# Patient Record
Sex: Male | Born: 1955 | Race: Black or African American | Hispanic: No | Marital: Married | Smoking: Never smoker
Health system: Southern US, Community
[De-identification: ages and names within clinical notes are randomized; demographics above are authoritative.]

## PROBLEM LIST (undated history)

## (undated) DIAGNOSIS — I2699 Other pulmonary embolism without acute cor pulmonale: Secondary | ICD-10-CM

## (undated) DIAGNOSIS — I10 Essential (primary) hypertension: Secondary | ICD-10-CM

## (undated) HISTORY — DX: Other pulmonary embolism without acute cor pulmonale: I26.99

## (undated) HISTORY — DX: Essential (primary) hypertension: I10

---

## 2009-03-28 ENCOUNTER — Encounter: Admission: RE | Admit: 2009-03-28 | Discharge: 2009-03-28 | Payer: Self-pay | Admitting: Family Medicine

## 2009-08-23 ENCOUNTER — Encounter: Admission: RE | Admit: 2009-08-23 | Discharge: 2009-08-23 | Payer: Self-pay | Admitting: Family Medicine

## 2010-10-04 IMAGING — CT CT HEAD W/O CM
2 series · 16 of 30 positions shown, 20 images · non-contrast
Comparison: None

CLINICAL DATA: Headaches.

CT HEAD WITHOUT CONTRAST
TECHNIQUE: Contiguous axial images were obtained from the base of
the skull through the vertex without contrast.

[Series 2: head wo · axial · 0.49mm/px · z∈[-341,-207]mm · 13 of 32 slices shown, 17 images]
[im 3/32  brain]
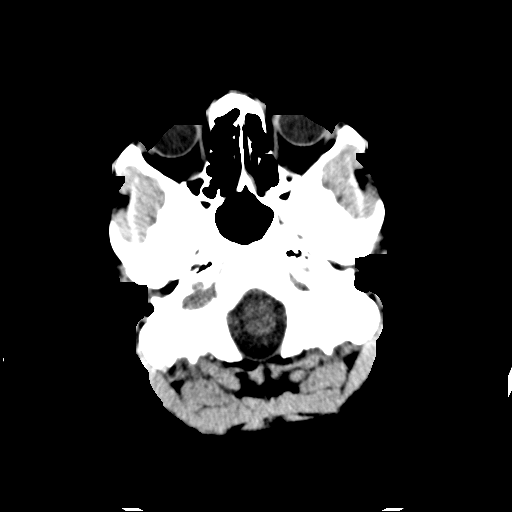
[im 3/32  bone]
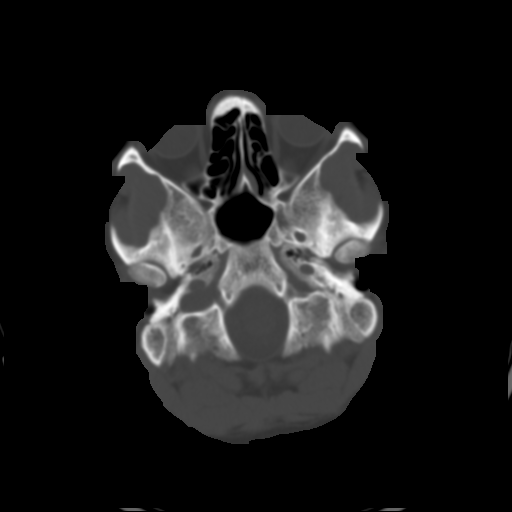
[im 5/32  brain]
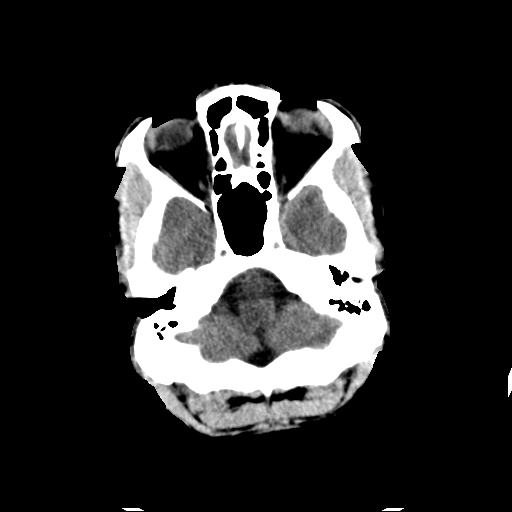
[im 7/32  brain]
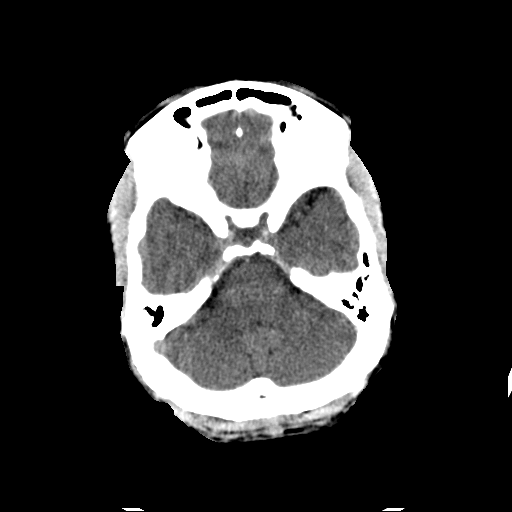
[im 9/32  brain]
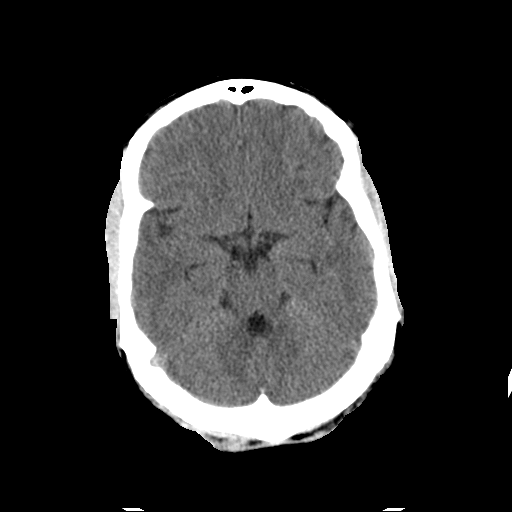
[im 12/32  brain]
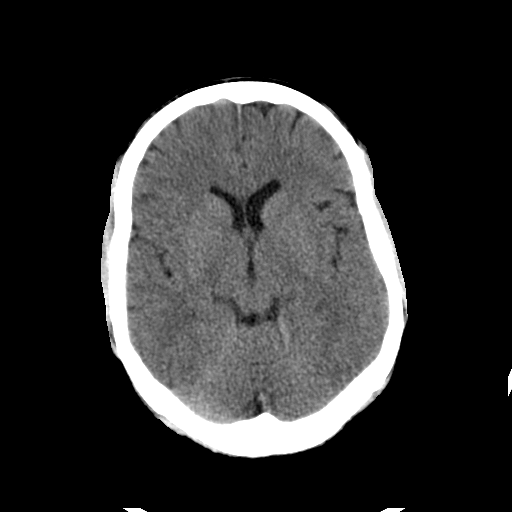
[im 12/32  bone]
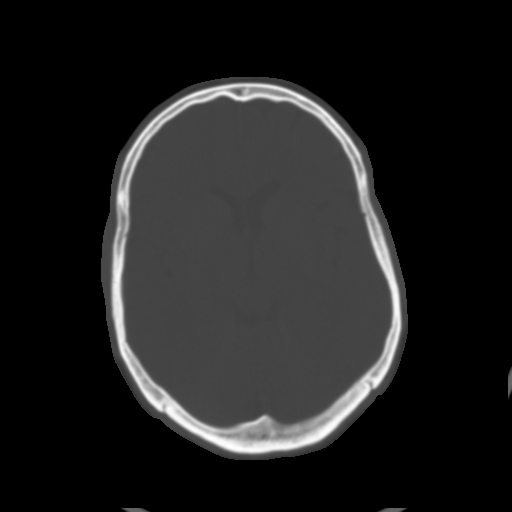
[im 14/32  brain]
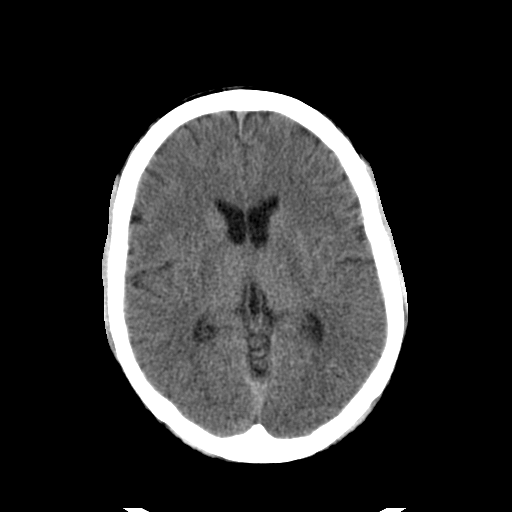
[im 16/32  brain]
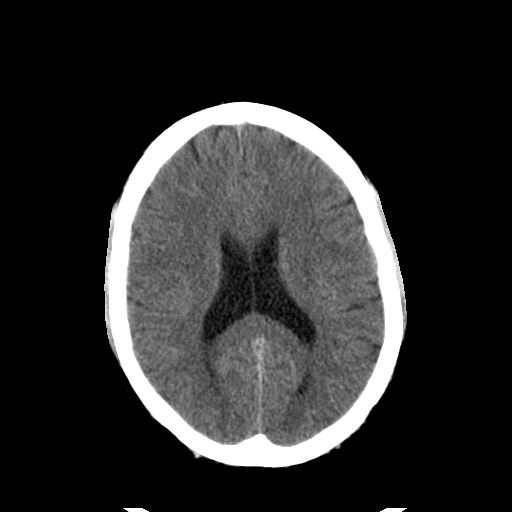
[im 18/32  brain]
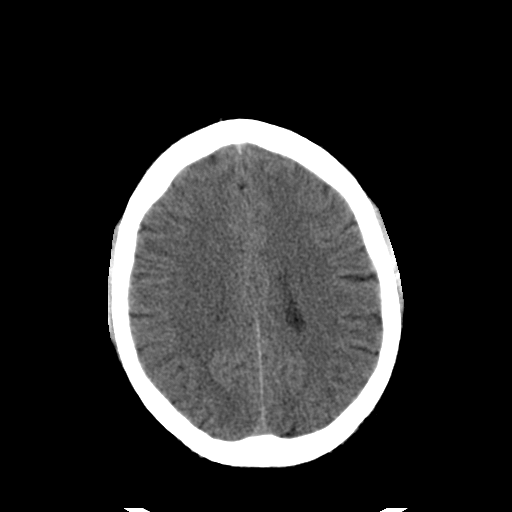
[im 20/32  brain]
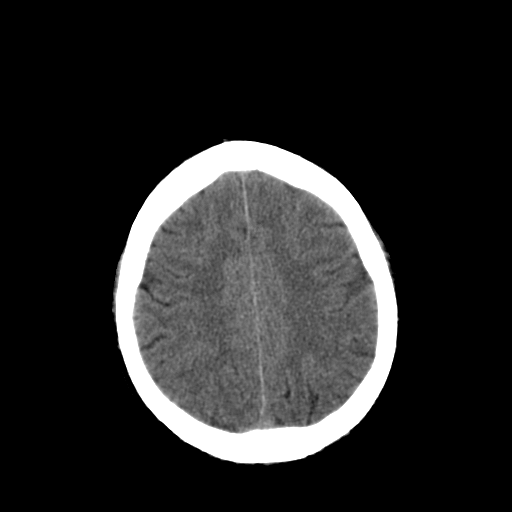
[im 20/32  bone]
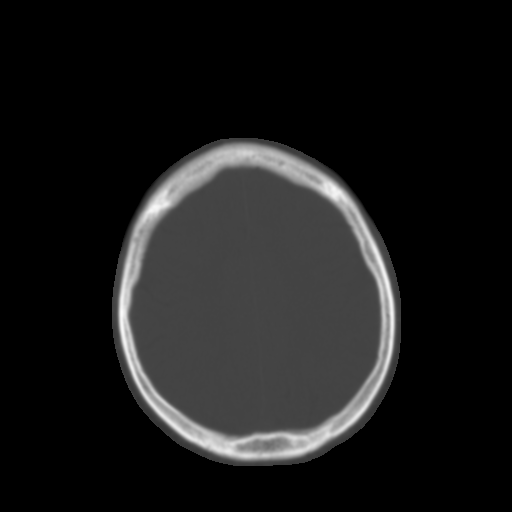
[im 23/32  brain]
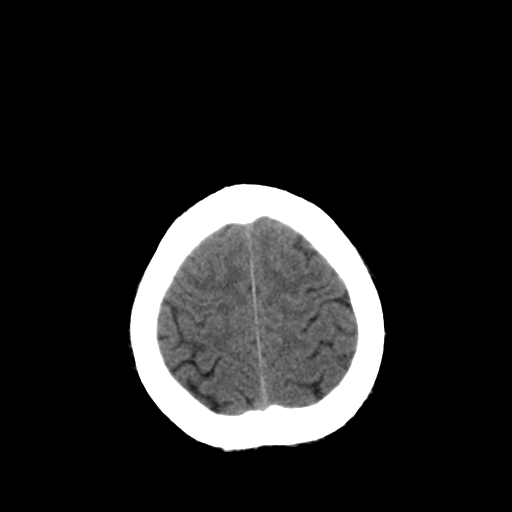
[im 25/32  brain]
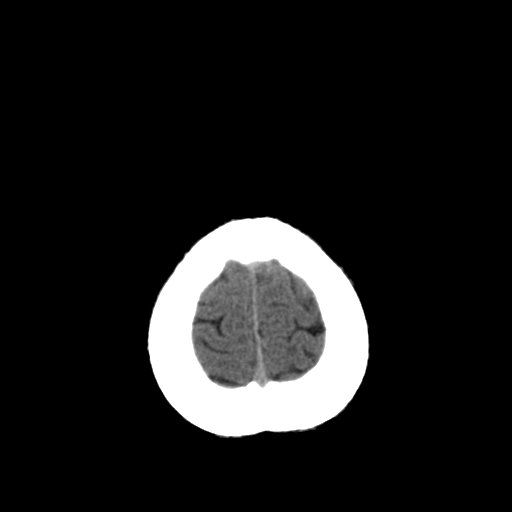
[im 27/32  brain]
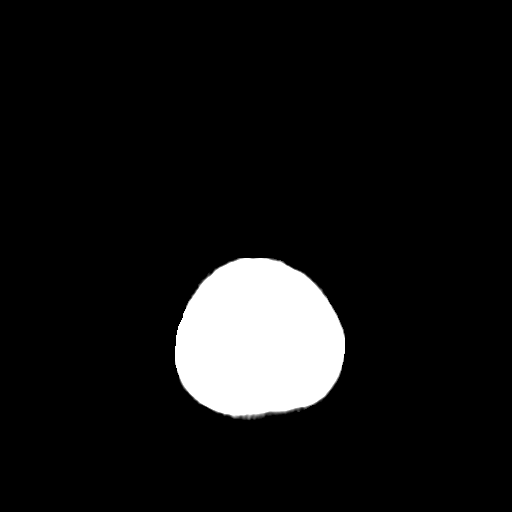
[im 29/32  brain]
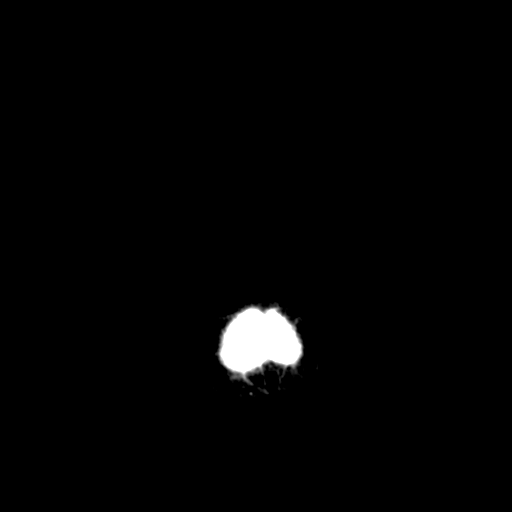
[im 29/32  bone]
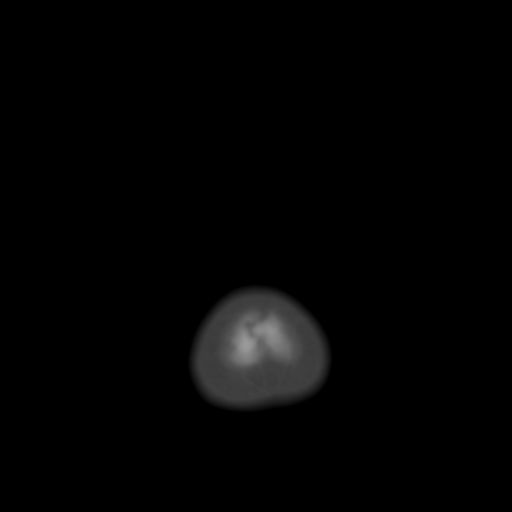

[Series 3: head bone · axial · 0.49mm/px · z∈[-341,-294]mm · 3 of 32 slices shown]
[im 3/32  bone]
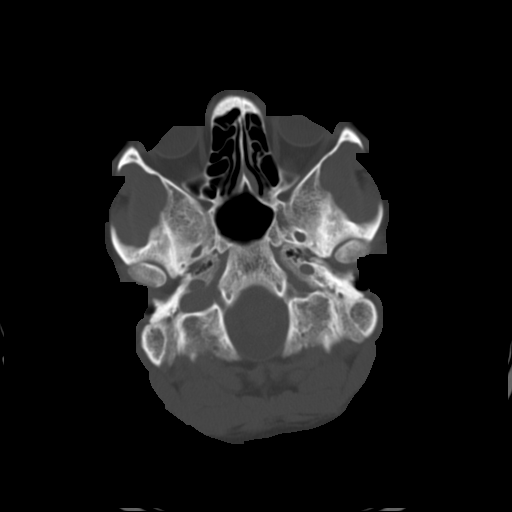
[im 7/32  bone]
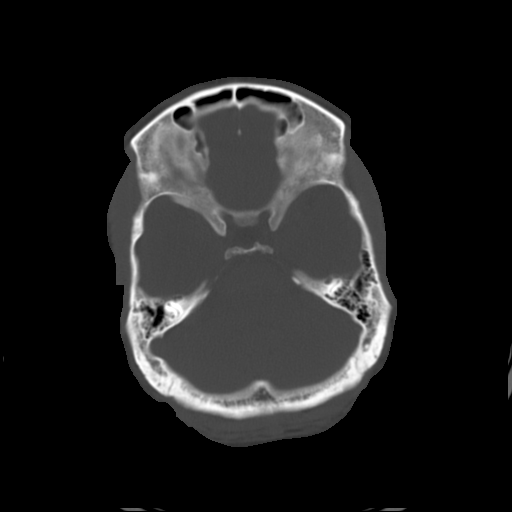
[im 12/32  bone]
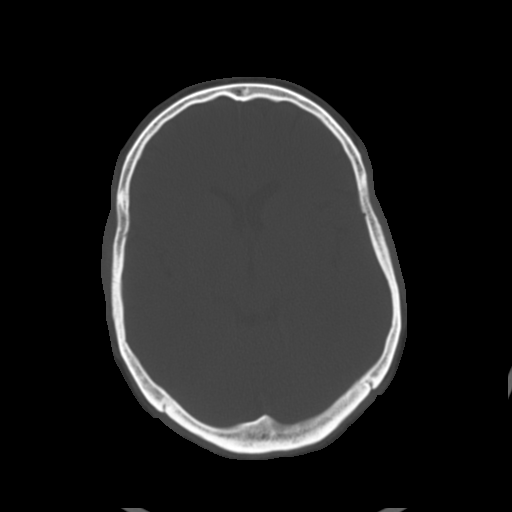

[16 of 30 positions shown; findings below may reference images not displayed]

FINDINGS: The ventricles are normal.  No extra-axial fluid
collections are seen.  The brainstem and cerebellum are
unremarkable.  No acute intracranial findings such as infarction or
hemorrhage.  No mass lesions.

The bony calvarium is intact.  The visualized paranasal sinuses and
mastoid air cells are clear.
IMPRESSION: No acute intracranial findings or mass lesions.

## 2016-07-18 ENCOUNTER — Ambulatory Visit: Payer: Self-pay | Admitting: Cardiovascular Disease

## 2020-09-20 ENCOUNTER — Encounter: Payer: Self-pay | Admitting: Pulmonary Disease

## 2020-09-20 ENCOUNTER — Ambulatory Visit: Payer: No Typology Code available for payment source | Admitting: Pulmonary Disease

## 2020-09-20 ENCOUNTER — Other Ambulatory Visit: Payer: Self-pay

## 2020-09-20 VITALS — BP 148/66 | HR 94 | Temp 98.0°F | Ht 67.5 in | Wt 174.4 lb

## 2020-09-20 DIAGNOSIS — I2699 Other pulmonary embolism without acute cor pulmonale: Secondary | ICD-10-CM

## 2020-09-20 NOTE — Progress Notes (Signed)
Synopsis: Referred by Peri Maris, NP for pulmonary emboli  Subjective:   PATIENT ID: Grant Sanders GENDER: male DOB: 1956/01/15, MRN: [id number]   HPI  Chief Complaint  Patient presents with  . Consult    Denies any breathing isuues. History of PE   Grant Sanders is a 64 year old male, never smoker with history of hypertension and pulmonary embolus who is referred to pulmonary clinic for his history of PE.   He reports being diagnosed with a pulmonary embolus and left lower extremity DVT in 2017 or 2018 when he moved from [place] to [place]. He was treated at a hospital in [place] with heparin drip initially and then transitioned to eliquis for 3 to 6 months. He is not too sure about the duration of therapy. He reports he had blood testing after he was off the eliquis to determine if he was at risk for blood clots and he says the testing was not concerning for hypercoagulable disorders. There is no family history of blood clots. It is thought to be the long distance drive was the provoking factor at that time. I do not have the records on file yet from [place] for review.  He has not had any issues since that episode and does not have any respiratory complaints at this time.   Past Medical History:  Diagnosis Date  . Hypertension   . Pulmonary embolism (HCC)      History reviewed. No pertinent family history.   Social History   Socioeconomic History  . Marital status: Married    Spouse name: Not on file  . Number of children: Not on file  . Years of education: Not on file  . Highest education level: Not on file  Occupational History  . Not on file  Tobacco Use  . Smoking status: Never Smoker  . Smokeless tobacco: Never Used  Substance and Sexual Activity  . Alcohol use: Not on file  . Drug use: Not on file  . Sexual activity: Not on file  Other Topics Concern  . Not on file  Social History Narrative  . Not on file   Social Determinants of Health    Financial Resource Strain:   . Difficulty of Paying Living Expenses: Not on file  Food Insecurity:   . Worried About Programme researcher, broadcasting/film/video in the Last Year: Not on file  . Ran Out of Food in the Last Year: Not on file  Transportation Needs:   . Lack of Transportation (Medical): Not on file  . Lack of Transportation (Non-Medical): Not on file  Physical Activity:   . Days of Exercise per Week: Not on file  . Minutes of Exercise per Session: Not on file  Stress:   . Feeling of Stress : Not on file  Social Connections:   . Frequency of Communication with Friends and Family: Not on file  . Frequency of Social Gatherings with Friends and Family: Not on file  . Attends Religious Services: Not on file  . Active Member of Clubs or Organizations: Not on file  . Attends Banker Meetings: Not on file  . Marital Status: Not on file  Intimate Partner Violence:   . Fear of Current or Ex-Partner: Not on file  . Emotionally Abused: Not on file  . Physically Abused: Not on file  . Sexually Abused: Not on file     Not on File   Outpatient Medications Prior to Visit  Medication Sig Dispense Refill  .  amLODipine (NORVASC) 2.5 MG tablet Take 2.5 mg by mouth daily.    Marland Kitchen aspirin 81 MG chewable tablet Chew by mouth.    . hydrochlorothiazide (HYDRODIURIL) 25 MG tablet Take by mouth.    . Travoprost, BAK Free, (TRAVATAN) 0.004 % SOLN ophthalmic solution 1 drop at bedtime.     No facility-administered medications prior to visit.    Review of Systems  Constitutional: Negative for chills, fever, malaise/fatigue and weight loss.  HENT: Negative for congestion and sore throat.   Eyes: Negative.   Respiratory: Negative for cough, hemoptysis, sputum production, shortness of breath and wheezing.   Cardiovascular: Negative for chest pain and palpitations.  Gastrointestinal: Negative for abdominal pain, heartburn, nausea and vomiting.  Genitourinary: Negative.   Musculoskeletal: Negative.    Neurological: Negative for dizziness, weakness and headaches.  Endo/Heme/Allergies: Negative.   Psychiatric/Behavioral: Negative.     Objective:   Vitals:   09/20/20 1354  BP: (!) 148/66  Pulse: 94  Temp: 98 F (36.7 C)  TempSrc: Oral  SpO2: 97%  Weight: 174 lb 6.4 oz (79.1 kg)  Height: 5' 7.5" (1.715 m)     Physical Exam Constitutional:      General: He is not in acute distress.    Appearance: Normal appearance. He is normal weight.  HENT:     Head: Normocephalic and atraumatic.  Eyes:     General: No scleral icterus.    Extraocular Movements: Extraocular movements intact.     Conjunctiva/sclera: Conjunctivae normal.     Pupils: Pupils are equal, round, and reactive to light.  Cardiovascular:     Rate and Rhythm: Normal rate and regular rhythm.     Pulses: Normal pulses.     Heart sounds: Normal heart sounds.  Pulmonary:     Effort: Pulmonary effort is normal.     Breath sounds: Normal breath sounds.  Abdominal:     General: Bowel sounds are normal.     Palpations: Abdomen is soft.  Musculoskeletal:     Right lower leg: No edema.     Left lower leg: No edema.  Skin:    Capillary Refill: Capillary refill takes less than 2 seconds.  Neurological:     General: No focal deficit present.     Mental Status: He is alert.  Psychiatric:        Mood and Affect: Mood normal.        Behavior: Behavior normal.        Thought Content: Thought content normal.        Judgment: Judgment normal.     CBC No results found for: WBC, RBC, HGB, HCT, PLT, MCV, MCH, MCHC, RDW, LYMPHSABS, MONOABS, EOSABS, BASOSABS   Chest imaging: CXR 2019 The lungs are clear. The heart size and pulmonary vascularity are  normal. No adenopathy. No bone lesions.   PFT: No flowsheet data found.     Assessment & Plan:   Pulmonary embolism, unspecified chronicity, unspecified pulmonary embolism type, unspecified whether acute cor pulmonale present [facility])  Discussion: Grant Sanders is  a 64 year old male, never smoker with history of hypertension and pulmonary embolus who is referred to pulmonary clinic for his history of PE.   He does not have any on going active risk factors for recurring blood clots other than he has had one in the past. He does not require further anticoagulation at this time. We will obtain a copy of his hospitalization and follow up visit records for further review and be in touch  with the patient over the phone.   He is to follow up as needed.  Melody Comas, MD [facility] Office: [phone]   See Amion for Pager Details    Current Outpatient Medications:  .  amLODipine (NORVASC) 2.5 MG tablet, Take 2.5 mg by mouth daily., Disp: , Rfl:  .  aspirin 81 MG chewable tablet, Chew by mouth., Disp: , Rfl:  .  hydrochlorothiazide (HYDRODIURIL) 25 MG tablet, Take by mouth., Disp: , Rfl:  .  Travoprost, BAK Free, (TRAVATAN) 0.004 % SOLN ophthalmic solution, 1 drop at bedtime., Disp: , Rfl:

## 2020-09-20 NOTE — Patient Instructions (Addendum)
l review your records once I receive them and call you to discuss them.  Please have the [facility] hospital fax your records to [phone]  You can follow up as needed. Please give [place] a call if you need anything in the meantime.

## 2020-09-21 ENCOUNTER — Encounter: Payer: Self-pay | Admitting: Pulmonary Disease

## 2020-09-22 ENCOUNTER — Telehealth: Payer: Self-pay | Admitting: Pulmonary Disease

## 2020-09-27 NOTE — Telephone Encounter (Signed)
Encounter created in error.  Will close.

## 2022-09-24 ENCOUNTER — Other Ambulatory Visit: Payer: Self-pay | Admitting: Ophthalmology

## 2022-09-24 ENCOUNTER — Ambulatory Visit: Payer: Managed Care, Other (non HMO)

## 2022-09-24 DIAGNOSIS — H2012 Chronic iridocyclitis, left eye: Secondary | ICD-10-CM
